# Patient Record
Sex: Female | Born: 1995 | Hispanic: No | Marital: Single
Health system: Western US, Academic
[De-identification: ages and names within clinical notes are randomized; demographics above are authoritative.]

---

## 2020-08-21 ENCOUNTER — Emergency Department
Admission: EM | Admit: 2020-08-21 | Discharge: 2020-08-21 | Discharge status: Against Medical Advice | Payer: Self-pay | Attending: Emergency Medicine | Admitting: Emergency Medicine

## 2020-08-21 DIAGNOSIS — Z5321 Procedure and treatment not carried out due to patient leaving prior to being seen by health care provider: Secondary | ICD-10-CM | POA: Insufficient documentation

## 2020-08-21 MED ORDER — ACETAMINOPHEN 325 MG PO TABS
650.0000 mg | ORAL_TABLET | Freq: Once | ORAL | Status: AC
Start: 2020-08-21 — End: 2020-08-21
  Administered 2020-08-21 (×2): 650 mg via ORAL
  Filled 2020-08-21: qty 2

## 2020-08-21 NOTE — ED Provider Notes (Signed)
Left without being seen     Rennie Plowman, MD  Resident  08/21/20 [id number]       Payton Mccallum, MD  08/24/20 [phone]

## 2022-01-15 IMAGING — MR MRI LEFT HIP WITHOUT CONTRAST
4 of 6 series · 16 of 40 positions shown · IV contrast (gadolinium)
Comparison: None

________________________________________________________________________________________________ 
MRI LEFT HIP WITHOUT CONTRAST, 01/15/2022 [DATE]: 
CLINICAL INDICATION: Osteonecrosis in diseases classified elsewhere, left thigh.
TECHNIQUE: Multiplanar, multiecho position MR images of the hip were performed 
without intravenous gadolinium enhancement. Large field-of-view images were 
performed of the pelvis to include the contralateral hip for comparison. Patient 
was scanned on a 1.5T magnet.

[Series 501: survey · axial · 15.0mm · 1.57mm/px · z∈[-206,+44]mm · 3 of 14 slices shown]
[im 1/14]
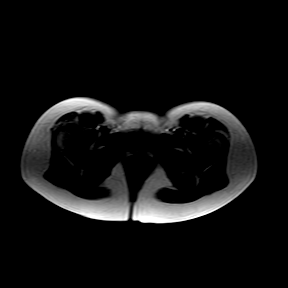
[im 7/14]
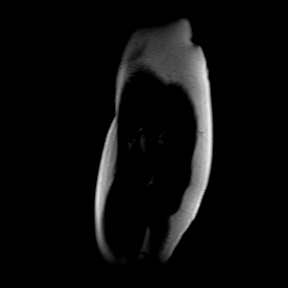
[im 14/14]
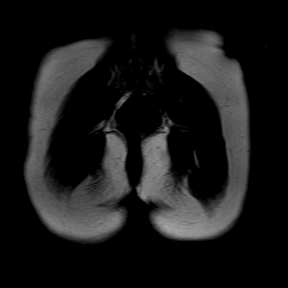

[Series 601: stir_cor-pelvis · coronal · 5.0mm · 0.66mm/px · 7 of 30 slices shown]
[im 1/30]
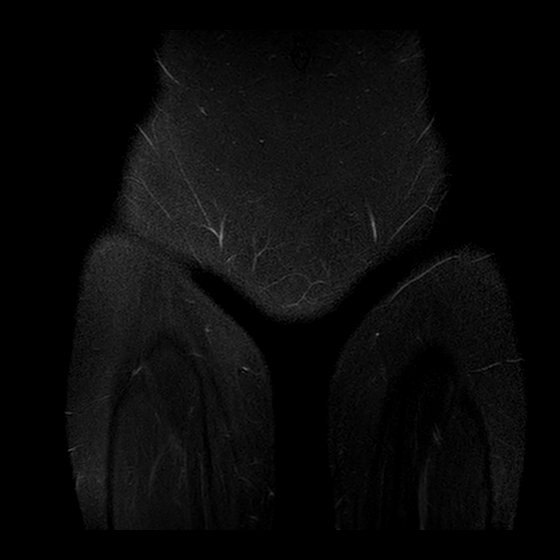
[im 5/30]
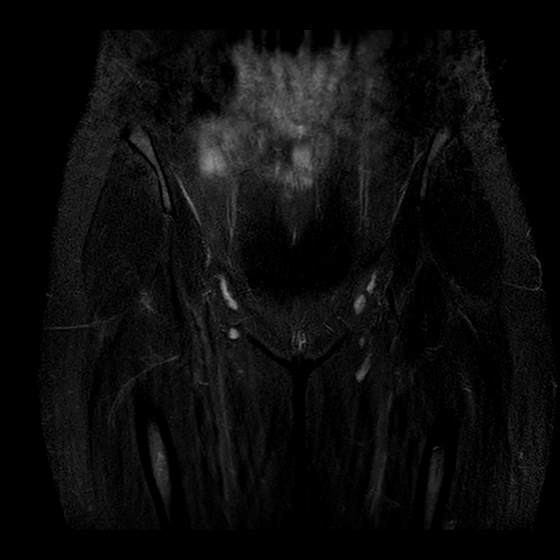
[im 10/30]
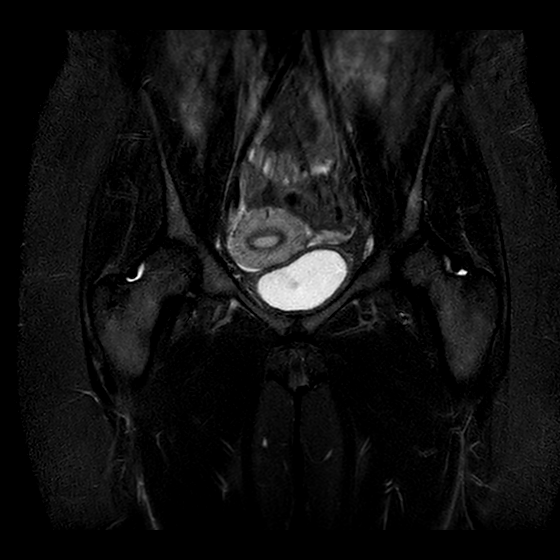
[im 15/30]
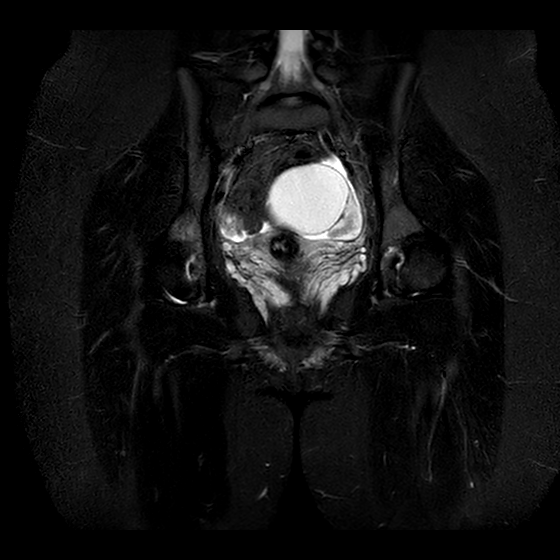
[im 20/30]
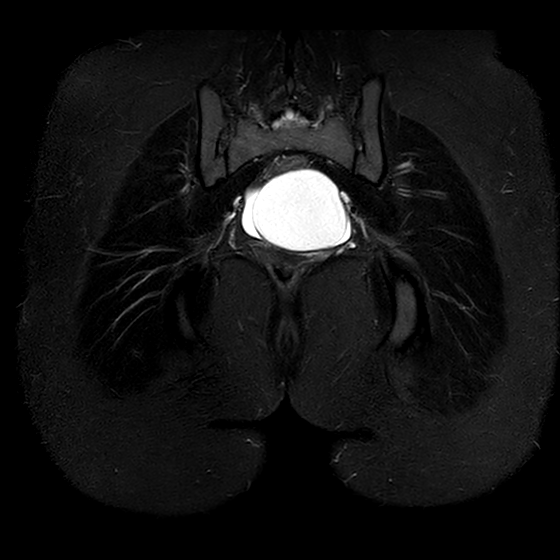
[im 25/30]
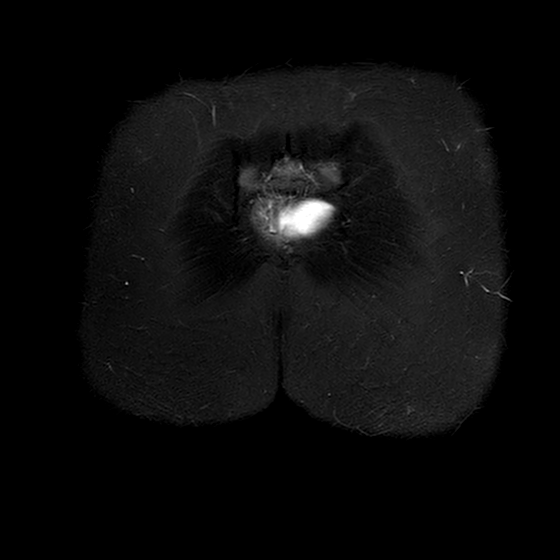
[im 30/30]
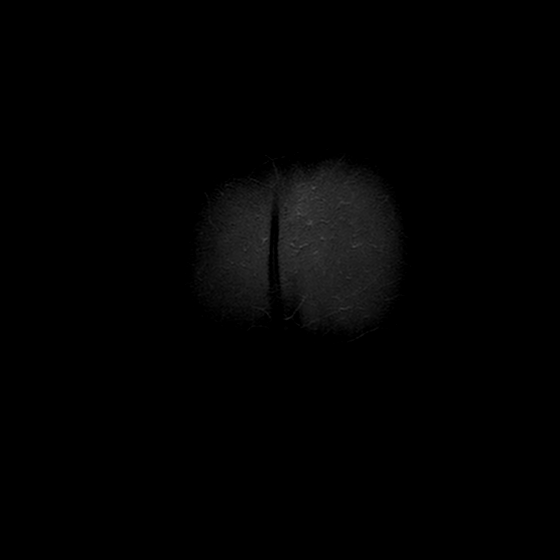

[Series 701: t1_(person_name) · axial · 5.0mm · 0.41mm/px · z∈[-263,-83]mm · 3 of 40 slices shown]
[im 5/40]
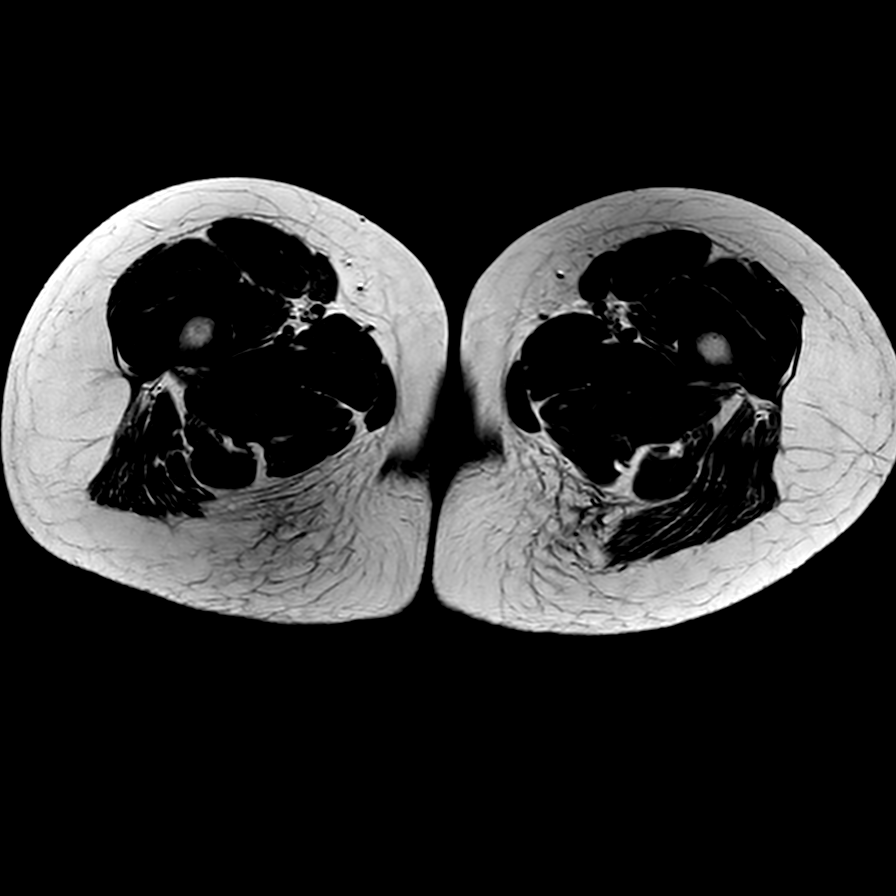
[im 22/40]
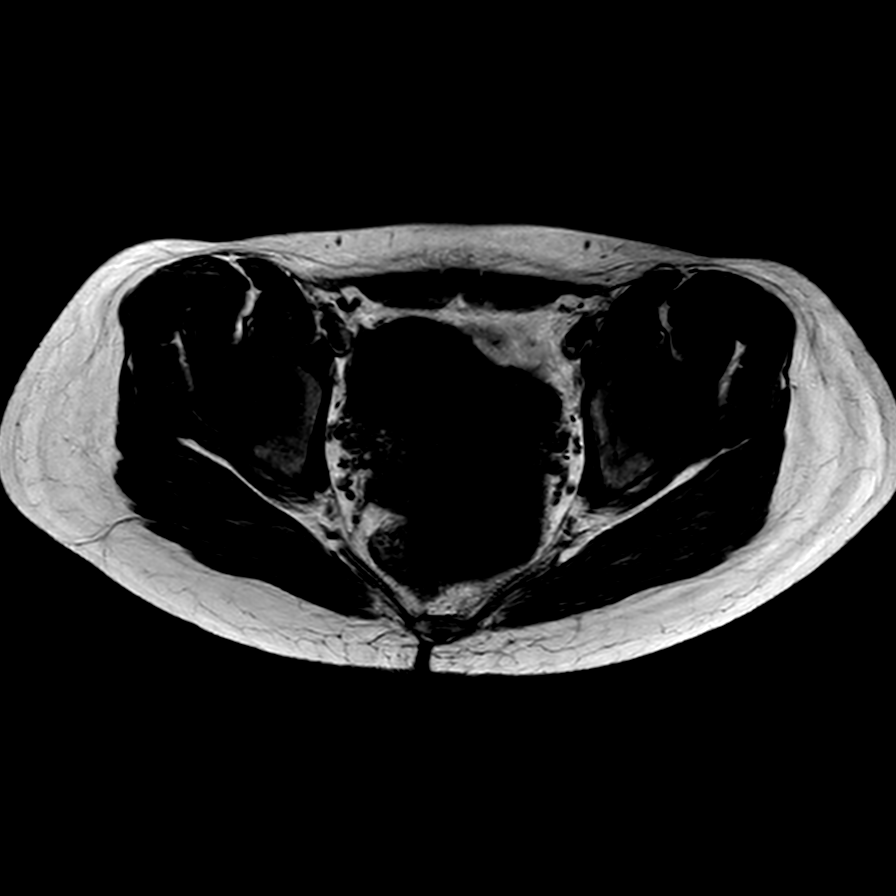
[im 35/40]
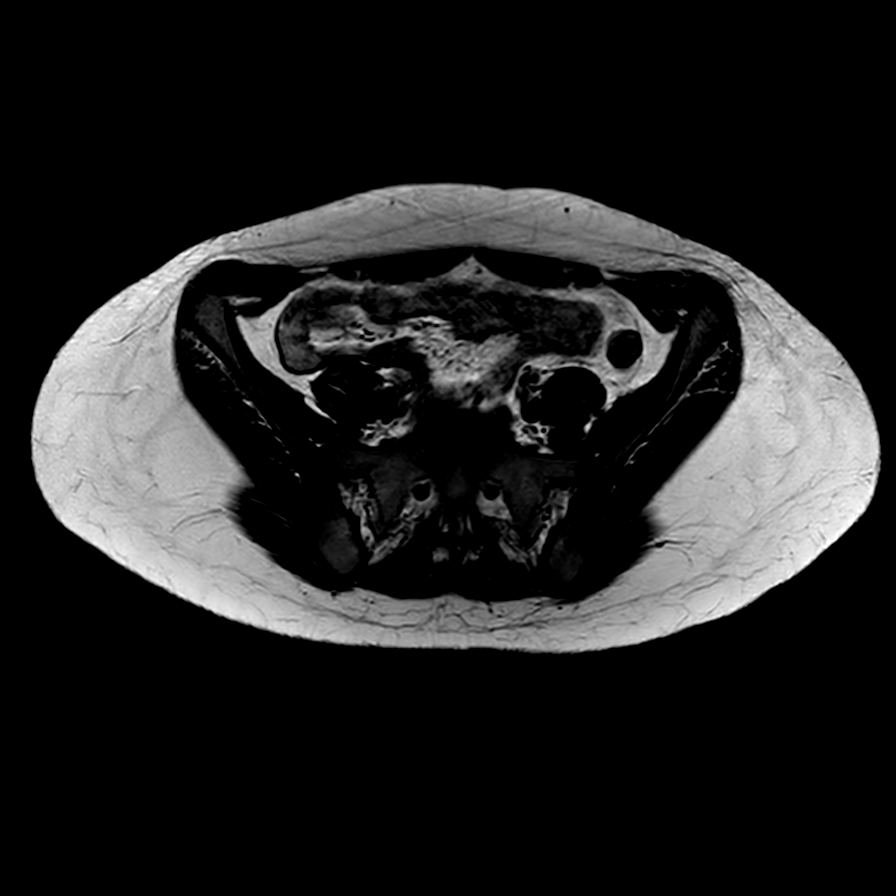

[Series 801: pd_fs_sag · sagittal · 4.0mm · 0.55mm/px · 3 of 32 slices shown]
[im 5/32]
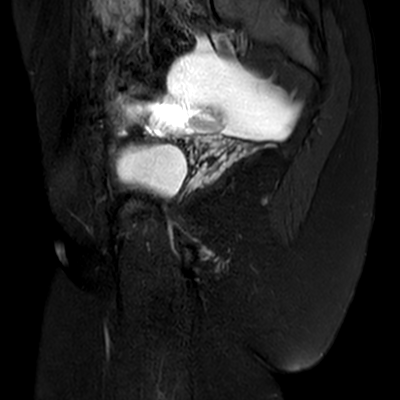
[im 18/32]
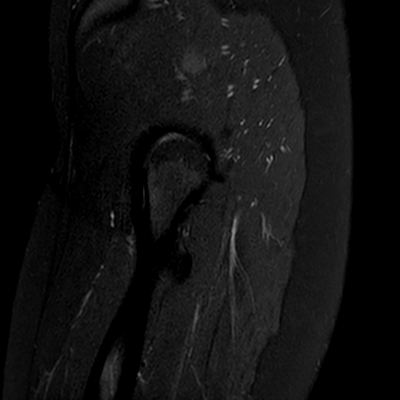
[im 27/32]
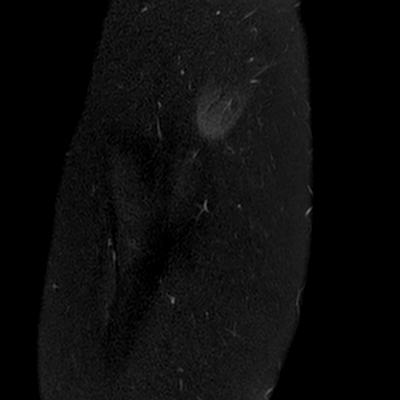

[16 of 40 positions shown; findings below may reference images not displayed]

FINDINGS: HIPS:  No discrete articular cartilaginous loss of either hip. No labral tear. 
No paralabral cyst. No hip joint effusion. Both femoral heads maintain a 
spherical configuration without evidence of avascular necrosis or subarticular 
collapse. No abnormal morphology of the proximal femurs or acetabula to 
predispose to impingement.  
BONES: Normal marrow signal intensity of the proximal hips, pelvis, sacrum and 
included lower lumbar spine. No fracture, contusion or marrow replacing lesion. 
SI joints and imaged spine are preserved. 0.9 cm sacral Tarlov cyst. 
SOFT TISSUES: The bilateral abductor cuffs are preserved. The insertions of the 
iliopsoas tendons are intact. The origins of the hamstrings are preserved. 
Rectus abdominis-adductor complex is preserved. The musculature is symmetric 
without strain, atrophy or mass. No focal fluid collection or distended bursa. 
Specifically, no iliopsoas or trochanteric bursitis. Included neurovascular 
bundles are negative. 8.3 x 5.1 x 5.9 cm left adnexal simple cyst. Small 
additional ovarian follicles. Small amount of free intraperitoneal fluid, likely 
physiologic.
IMPRESSION: 1.  No osteonecrosis. 
2.  8.3 x 5.1 x 5.9 cm left adnexal simple cyst.

## 2023-03-12 IMAGING — MR MRI PELVIS W/WO CONTRAST
14 series · 46 of 48 positions shown · IV contrast (gadavist)
Comparison: Ultrasound exam of 02/24/2023 and 11/28/2022.

________________________________________________________________________________________________ 
MRI PELVIS W/WO CONTRAST, 03/12/2023 [DATE]: 
CLINICAL INDICATION: Unspecified ovarian cyst, unspecified side
TECHNIQUE: Multiplanar, multiecho position MR images of the pelvis were 
performed without and with 6.5 mL of  Gadavist were injected intravenously by 
hand. 1 mL of Gadavist discarded. Patient was scanned on a 3T magnet.

[Series 101: survey · axial · 15.0mm · 1.76mm/px · z∈[-25,+224]mm · 2 of 11 slices shown]
[im 1/11]
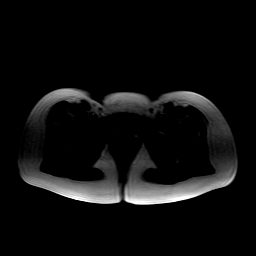
[im 11/11]
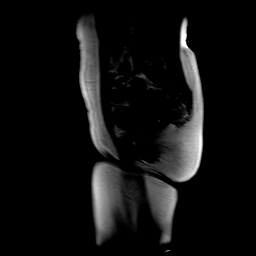

[Series 201: T2 · sagittal · 3.0mm · 0.36mm/px · 5 of 32 slices shown]
[im 1/32]
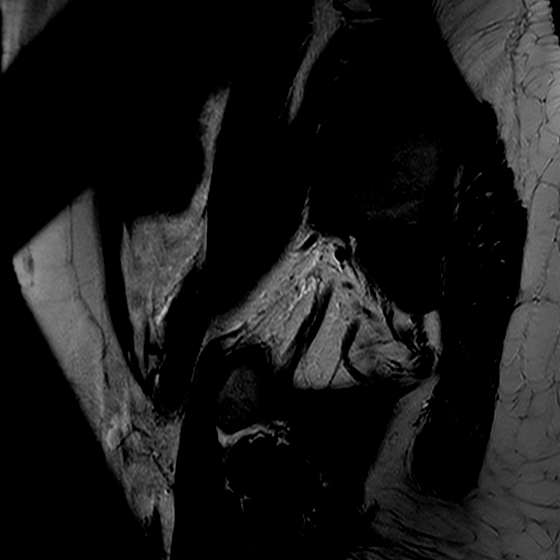
[im 8/32]
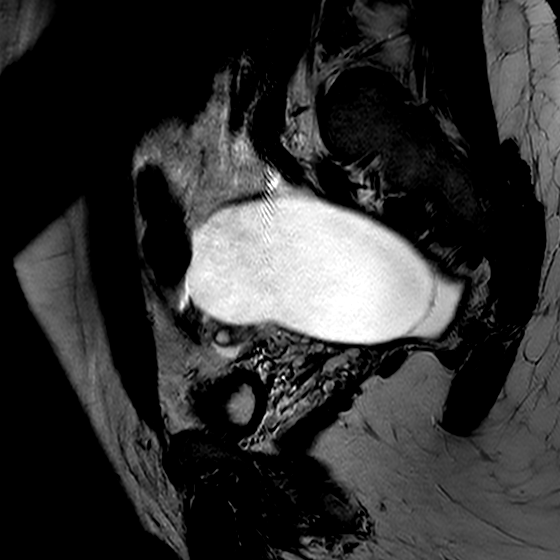
[im 16/32]
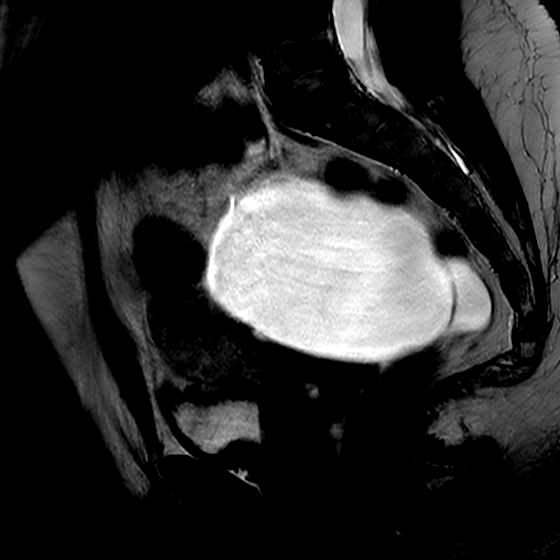
[im 24/32]
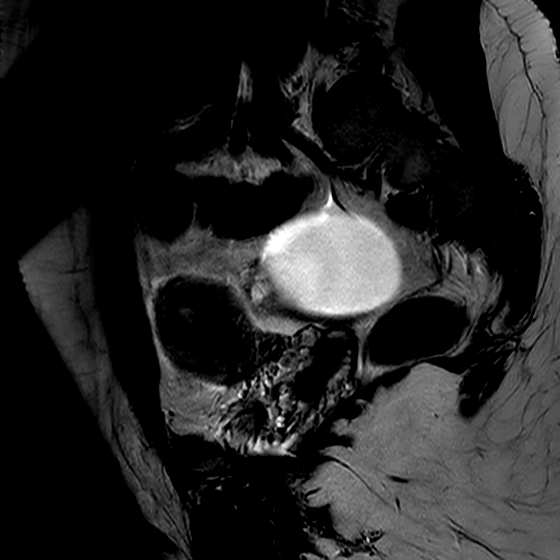
[im 32/32]
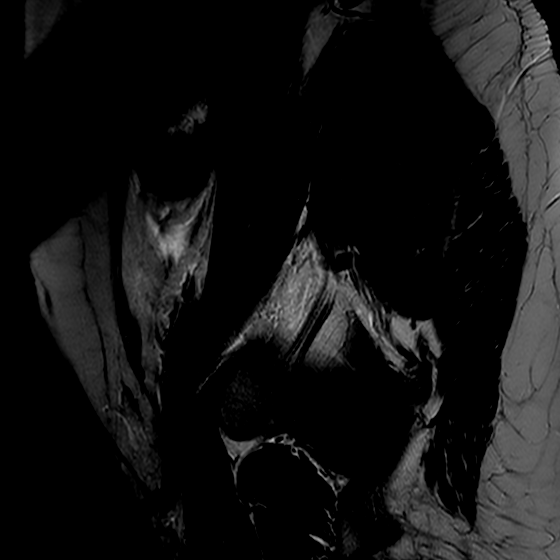

[Series 301: t2w_tse_ax · axial · 5.0mm · 0.57mm/px · z∈[-50,+106]mm · 3 of 27 slices shown]
[im 1/27]
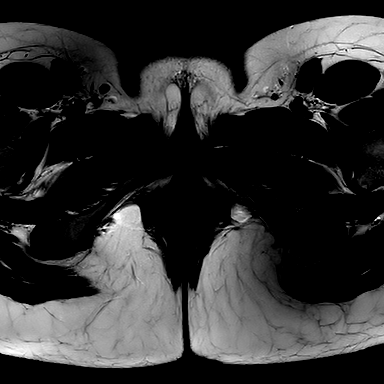
[im 14/27]
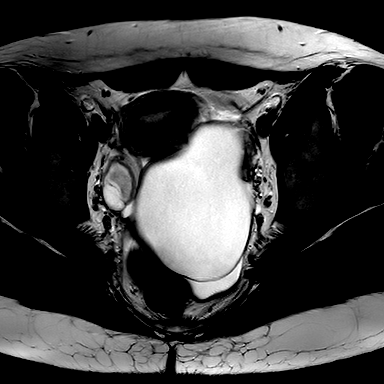
[im 27/27]
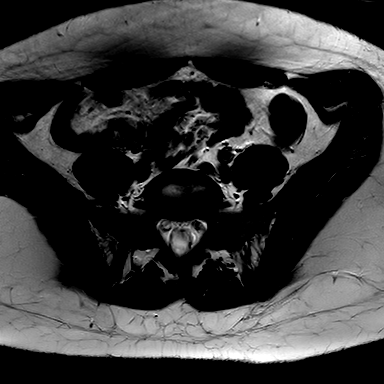

[Series 401: t1w_tse_ax · axial · 5.0mm · 0.69mm/px · z∈[-50,+106]mm · 3 of 27 slices shown]
[im 1/27]
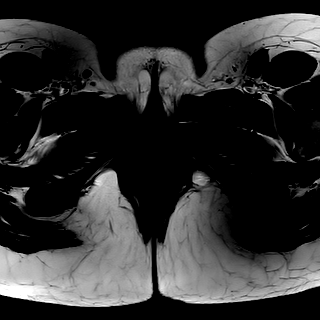
[im 14/27]
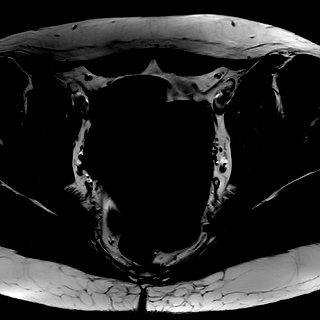
[im 27/27]
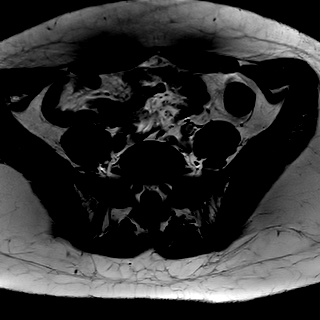

[Series 501: t1w/sp ax · axial · 5.0mm · 0.69mm/px · z∈[-50,+106]mm · 3 of 27 slices shown]
[im 1/27]
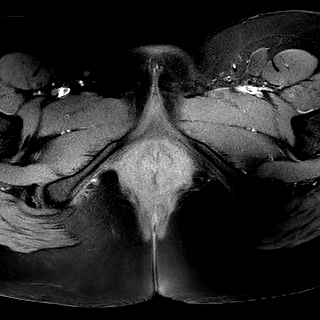
[im 14/27]
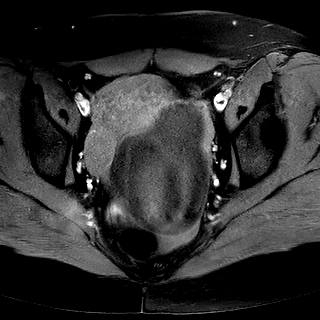
[im 27/27]
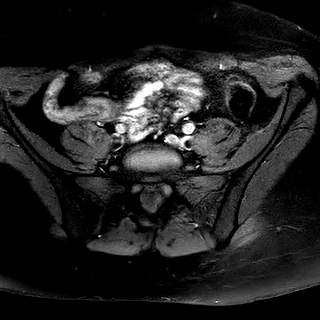

[Series 601: dwi_3b* 5mm*(mass) · axial · 5.0mm · 1.28mm/px · z∈[-38,+92]mm · 7 of 54 slices shown]
[im 1/54]
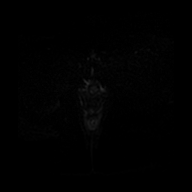
[im 9/54]
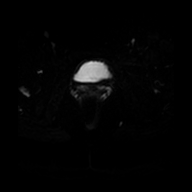
[im 18/54]
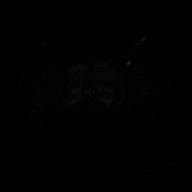
[im 27/54]
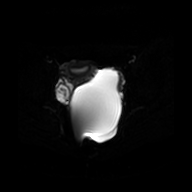
[im 36/54]
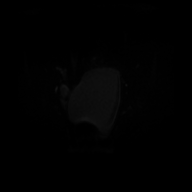
[im 45/54]
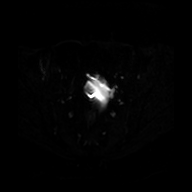
[im 54/54]
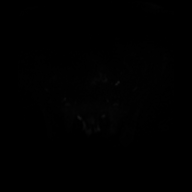

[Series 602: ADC · axial · 5.0mm · 1.28mm/px · z∈[-38,+92]mm · 3 of 27 slices shown (1 of 2)]
[im 1/27]
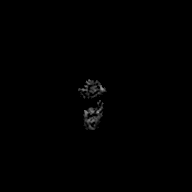
[im 14/27]
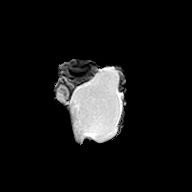
[im 27/27]
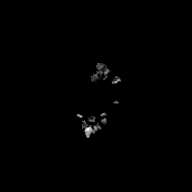

[Series 603: ADC · axial · 5.0mm · 1.28mm/px · z∈[-38,+92]mm · 3 of 27 slices shown (2 of 2)]
[im 1/27]
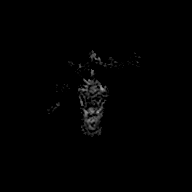
[im 14/27]
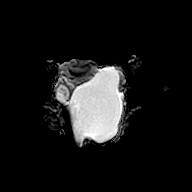
[im 27/27]
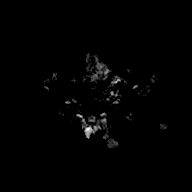

[Series 604: (id) · axial · 5.0mm · 1.28mm/px · z∈[-38,+92]mm · 3 of 27 slices shown (1 of 2)]
[im 1/27]
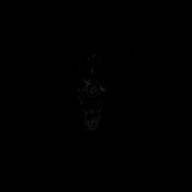
[im 14/27]
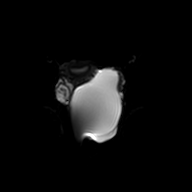
[im 27/27]
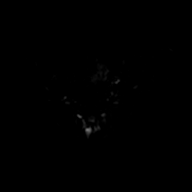

[Series 605: (id) · axial · 5.0mm · 1.28mm/px · z∈[-38,+92]mm · 3 of 27 slices shown (2 of 2)]
[im 1/27]
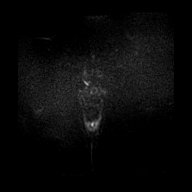
[im 14/27]
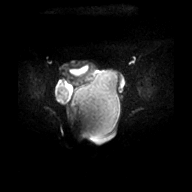
[im 27/27]
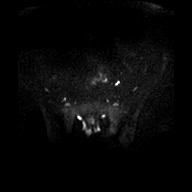

[Series 701: t2w_tse_cor · coronal · 4.0mm · 0.57mm/px · 3 of 27 slices shown]
[im 1/27]
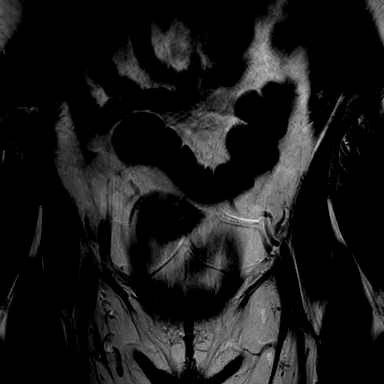
[im 14/27]
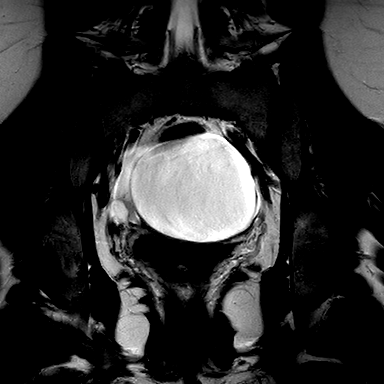
[im 27/27]
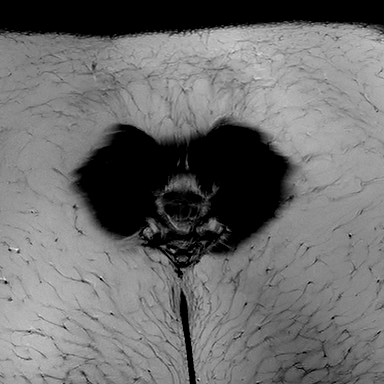

[Series 801: (person_name)1(person_name)/(person_name) · axial · 5.0mm · 0.69mm/px · z∈[-50,+106]mm · 3 of 27 slices shown]
[im 1/27]
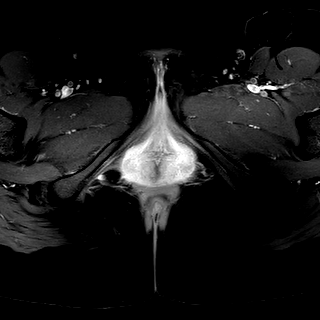
[im 14/27]
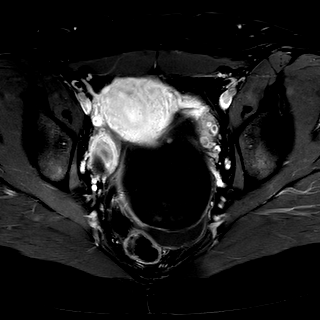
[im 27/27]
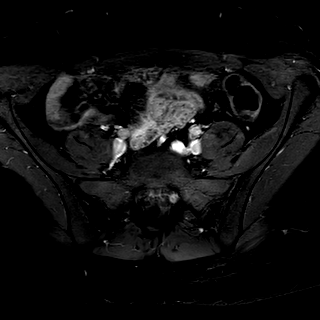

[Series 901: (person_name)1/sp cor · coronal · 4.0mm · 0.69mm/px · 4 of 30 slices shown]
[im 1/30]
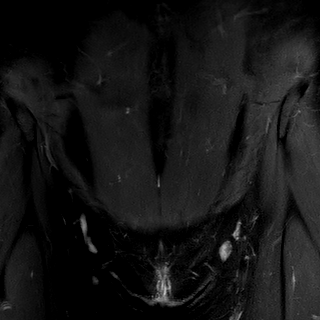
[im 10/30]
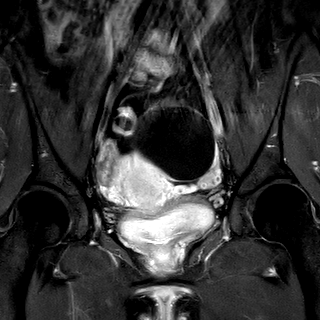
[im 20/30]
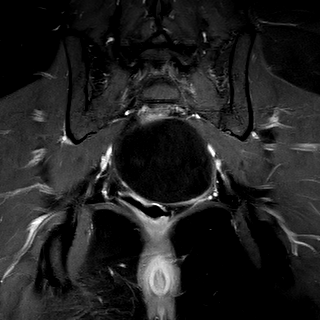
[im 30/30]
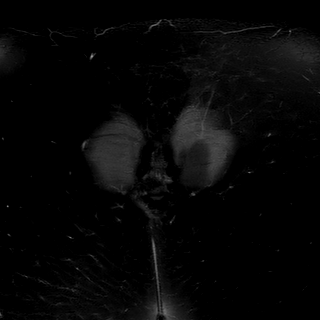

[Series 1001: (person_name)1 sag sp · sagittal · 4.0mm · 0.39mm/px · 1 of 28 slices shown]
[im 1/28]
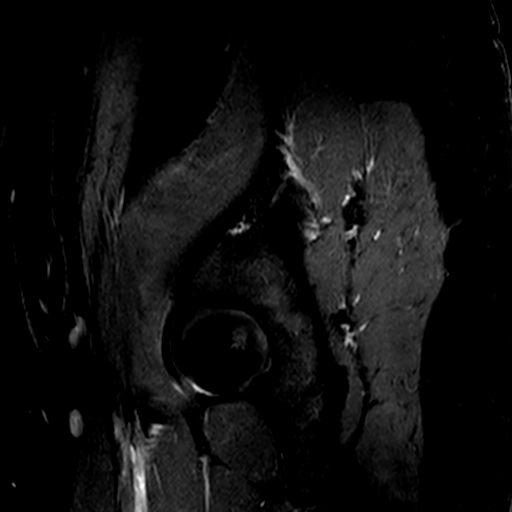

[46 of 48 positions shown; findings below may reference images not displayed]

FINDINGS: There is a left-sided adnexal cyst containing a thin septation measuring up to 
10.3 x 6.7 x 10.5 cm. No hemorrhagic or enhancing components. On the prior 
ultrasound exams there is a left-sided ovarian cyst measuring up to 1.5 x 4.8 x 
8.4 on the most recent ultrasound exam of 02/24/2023. There are additional 
bilateral ovarian cysts. These are subcentimeter on the left. On the right there 
is an involuting cyst measuring up to 3 x 2 x 1.5 cm. 
The uterus is anteverted and measures 7.8 x 4 x 4.3 cm. Normal junctional zone. 
There are a few subcentimeter nabothian cysts. The bladder is minimally 
distended. Included bowel loops are negative. Included muscular skeletal system 
is negative.
IMPRESSION: Persistent left adnexal cyst without solid mass or area of abnormal enhancement.

## 2023-05-27 IMAGING — DX SHOULDER RIGHT 2 VIEWS
2 series · 2 of 2 positions shown · non-contrast
Comparison: None.

________________________________________________________________________________________________ 
SHOULDER RIGHT 2 VIEWS, 05/27/2023 [DATE]: 
CLINICAL INDICATION: Pain In Right Shoulder

[AP]
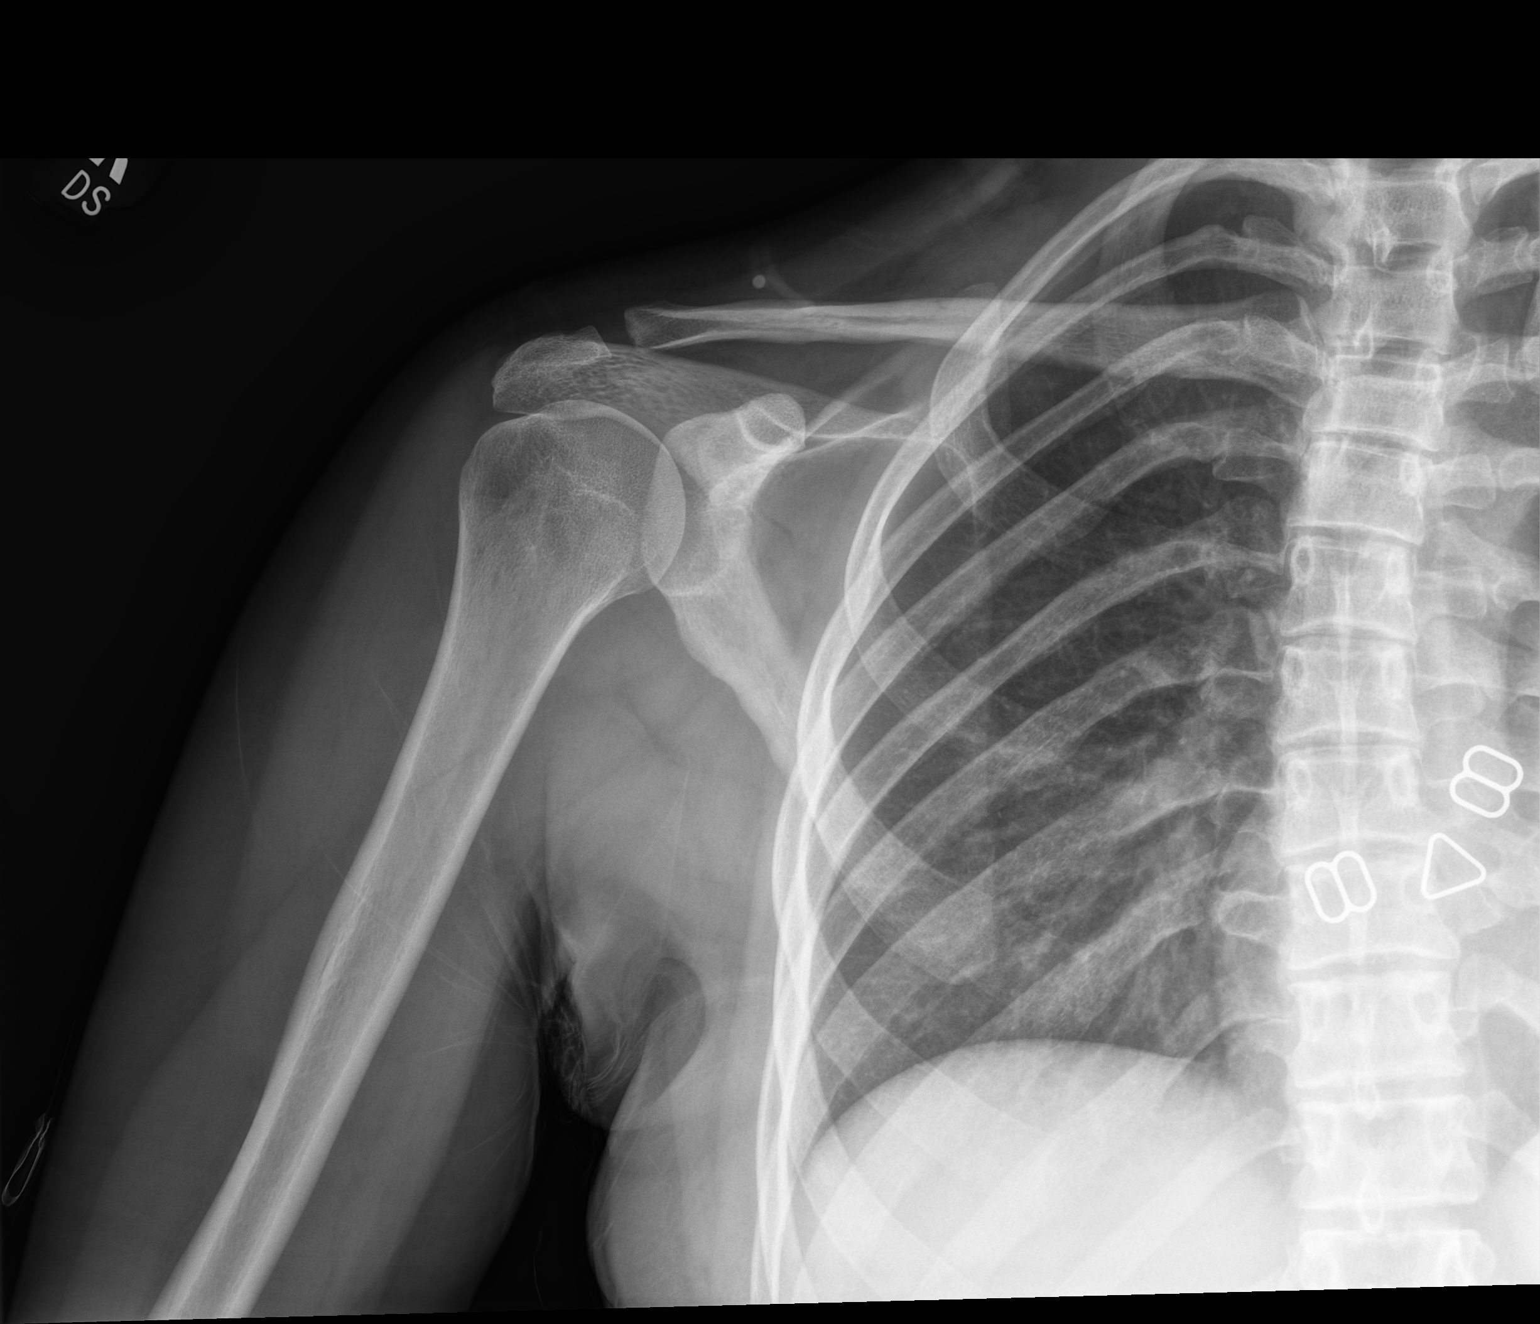

[grashey]
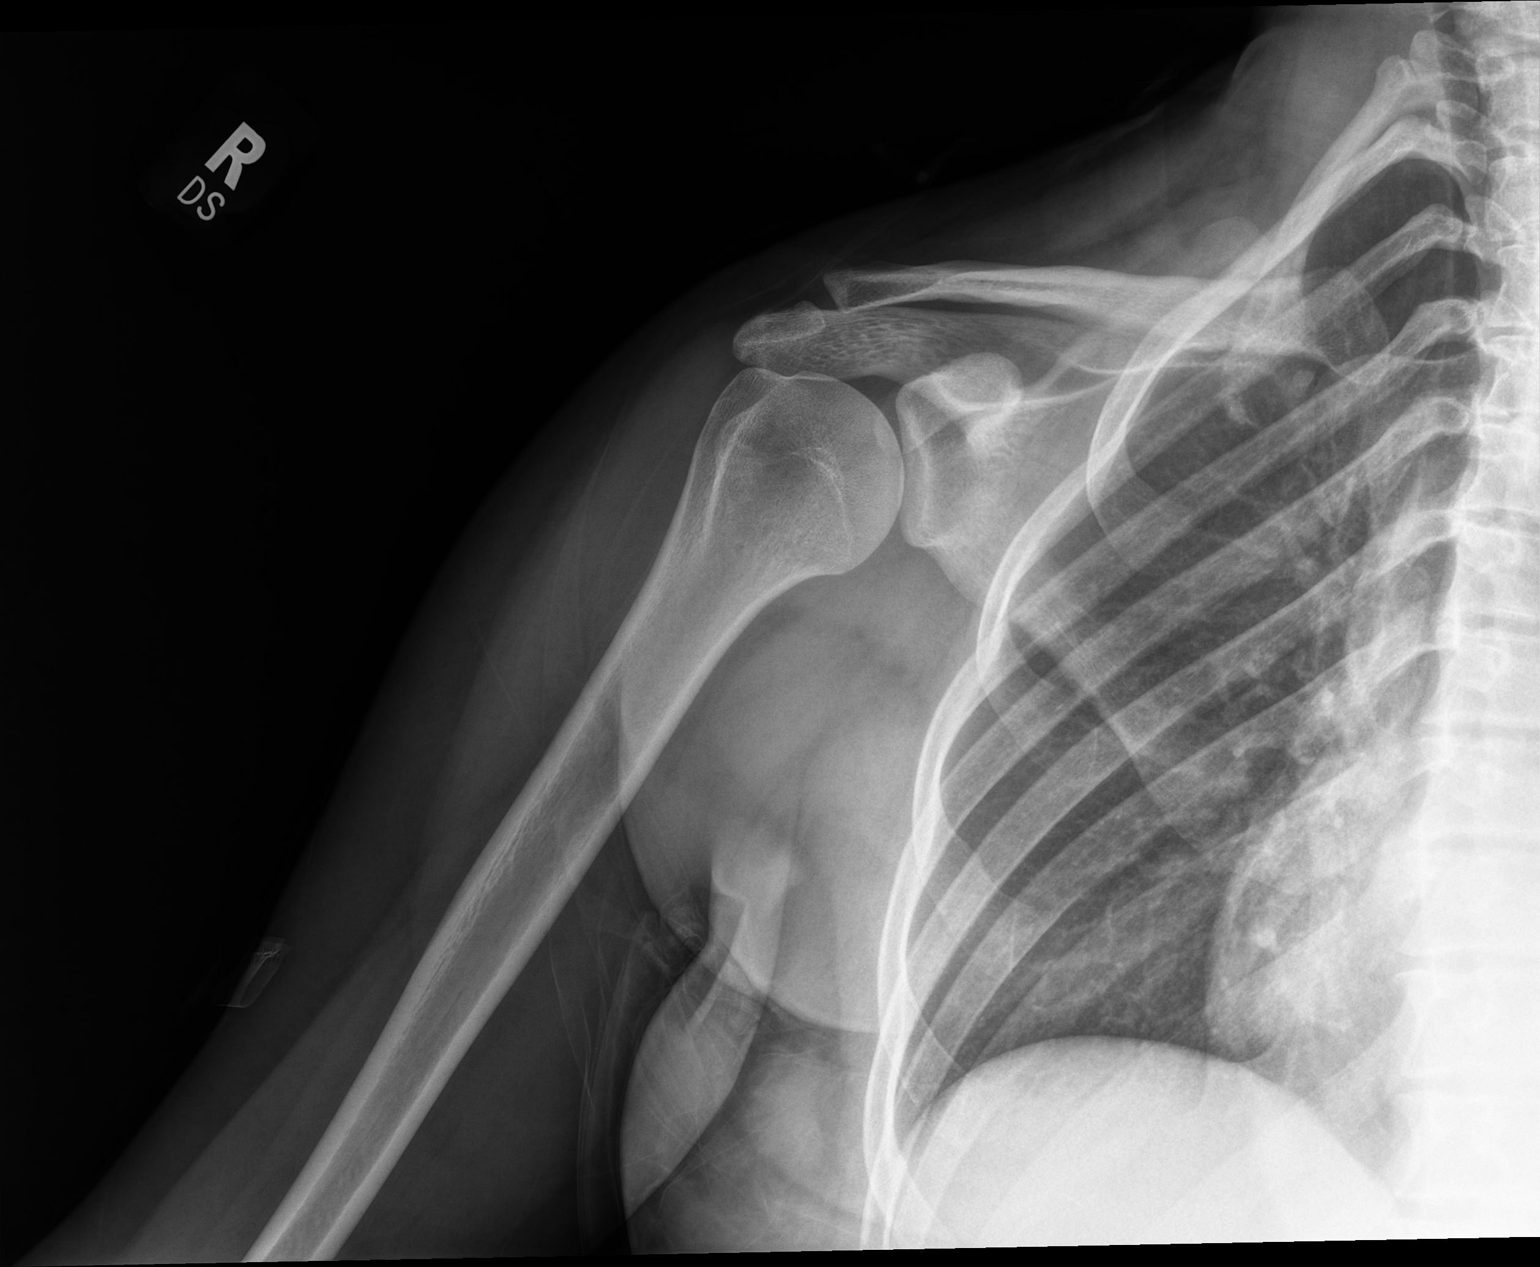

[2 of 2 positions shown; findings below may reference images not displayed]

FINDINGS: Right humeral head is well located within the glenoid fossa. The 
acromioclavicular and coracoclavicular spaces are preserved. No fracture. No 
subluxation. Included portions of the lungs are clear. Mild thoracic curvature.
IMPRESSION: Negative right shoulder.

## 2023-05-27 IMAGING — CT CT CHEST WITHOUT CONTRAST
2 of 4 series · 15 of 36 positions shown, 18 images · non-contrast
Comparison: Chest x-ray 05/06/2023.

________________________________________________________________________________________________ 
CT CHEST WITHOUT CONTRAST, 05/27/2023 [DATE]: 
CLINICAL INDICATION: Cough, Unspecified 
A search for DICOM formatted images was conducted for prior CT imaging studies 
completed at a non-affiliated media free facility.
TECHNIQUE: The chest was scanned from base of neck through the lung bases 
without contrast on a high resolution low dose CT scanner. Routine MPR and MIP 
reconstruction images were performed.

[Series 2: chest 2.0 i31s 3 · axial · 0.70mm/px · z∈[-304,-4]mm · 12 of 166 slices shown, 15 images]
[im 8/166  mediastinal]
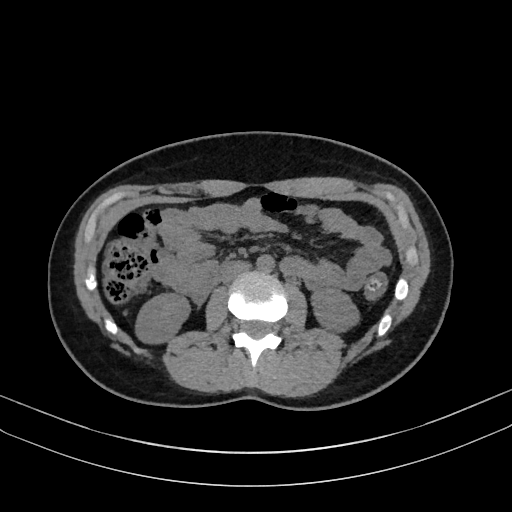
[im 8/166  lung]
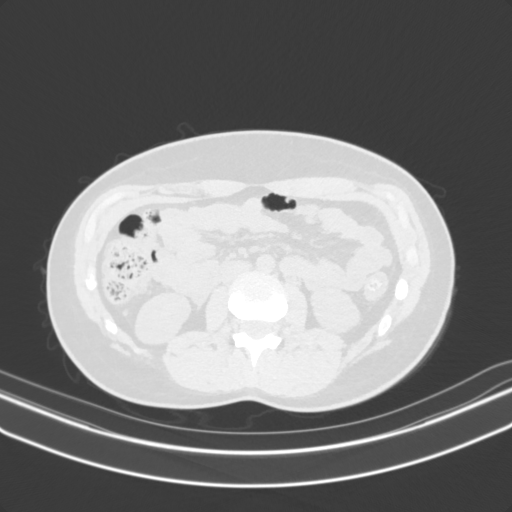
[im 23/166  lung]
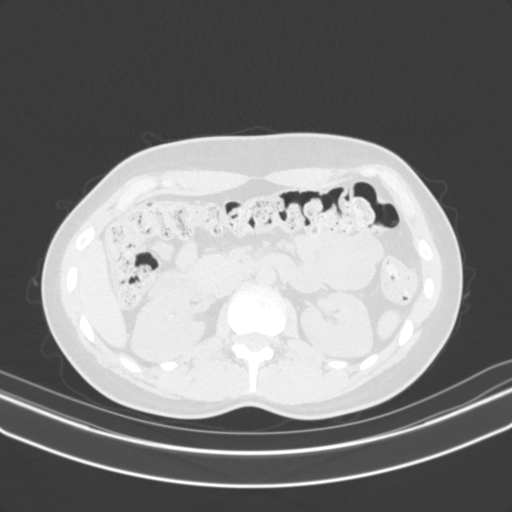
[im 38/166  lung]
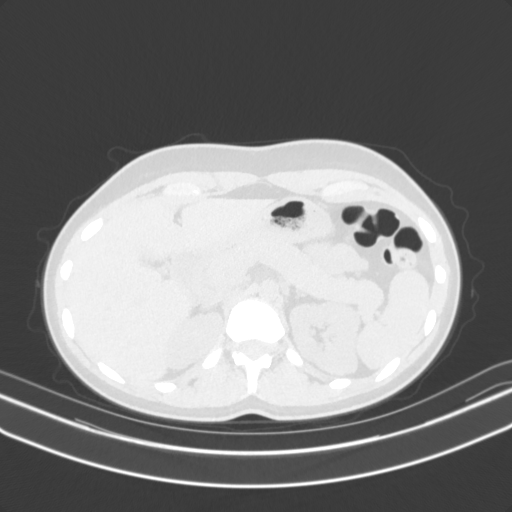
[im 53/166  lung]
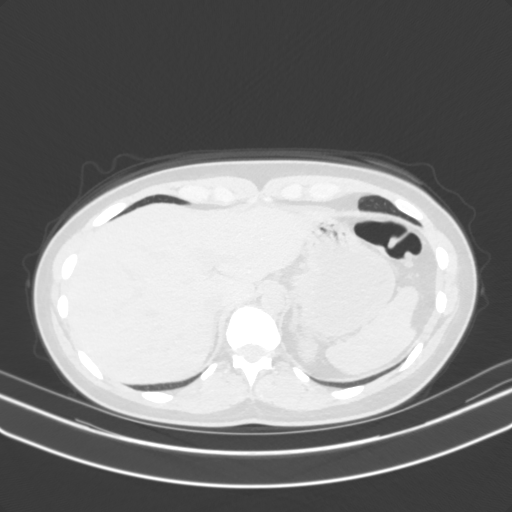
[im 61/166  mediastinal]
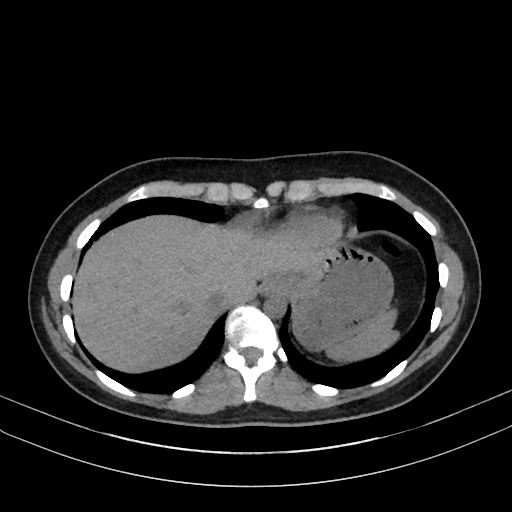
[im 61/166  lung]
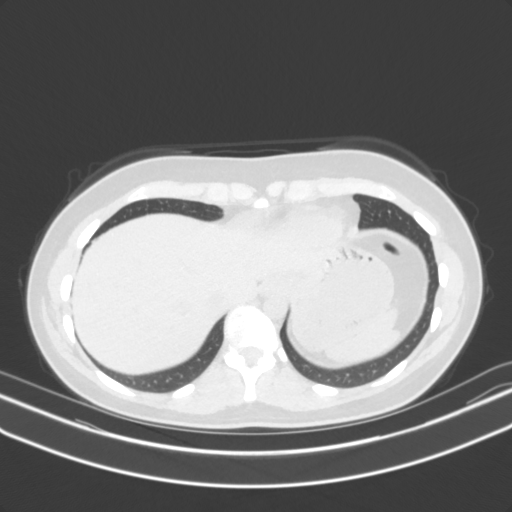
[im 76/166  lung]
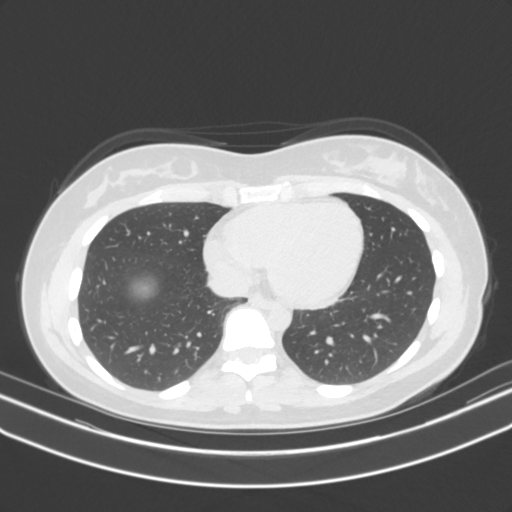
[im 91/166  lung]
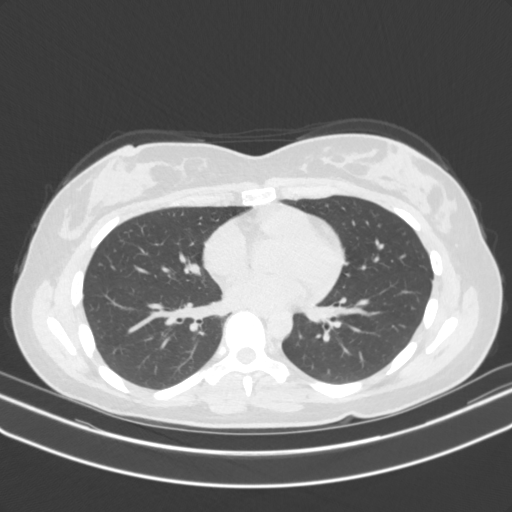
[im 106/166  lung]
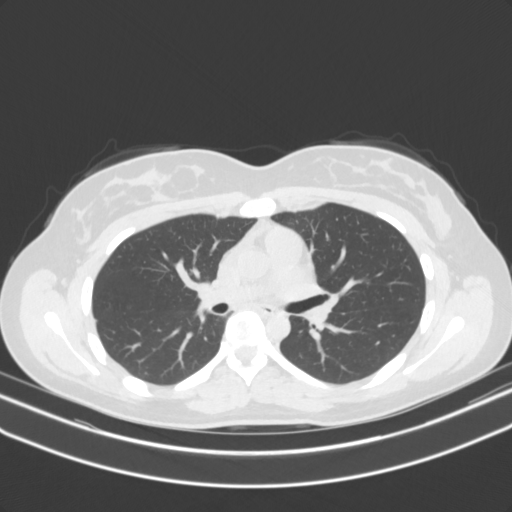
[im 113/166  mediastinal]
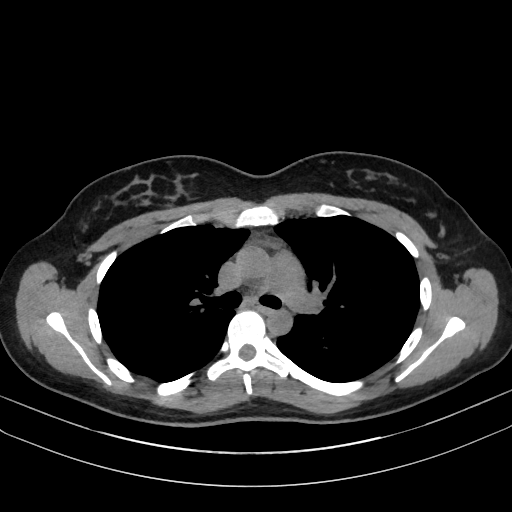
[im 113/166  lung]
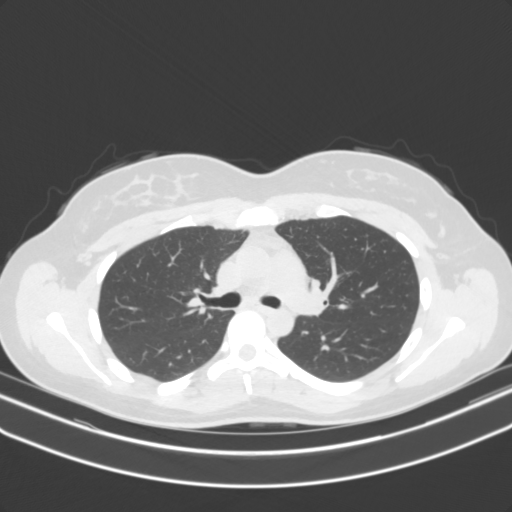
[im 128/166  lung]
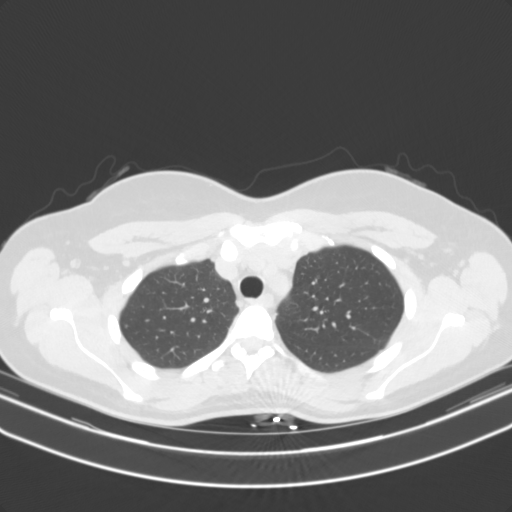
[im 143/166  lung]
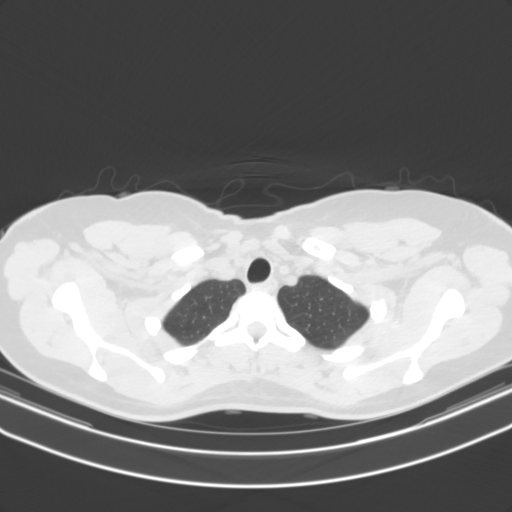
[im 158/166  lung]
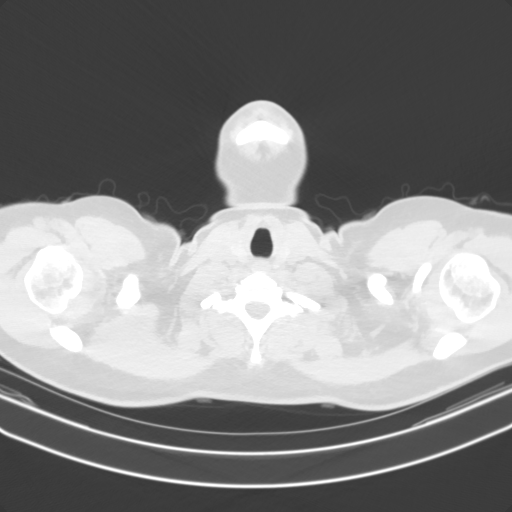

[Series 4: coronal · coronal · 0.65mm/px · 3 of 113 slices shown]
[im 23/113  lung]
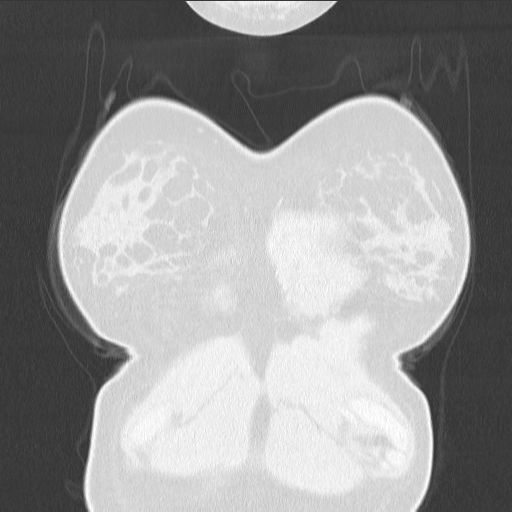
[im 45/113  lung]
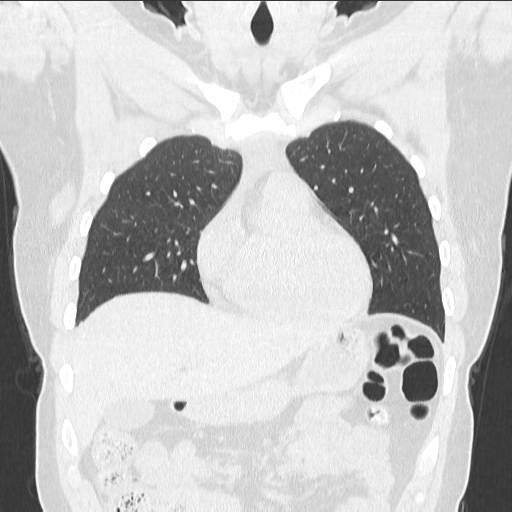
[im 68/113  lung]
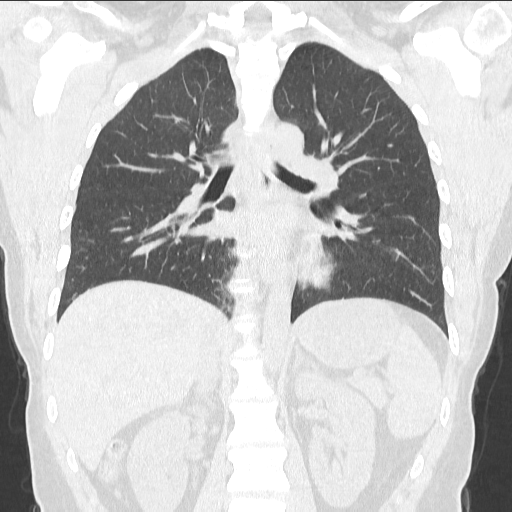

[15 of 36 positions shown; findings below may reference images not displayed]

Count of known CT and Cardiac Nuclear Medicine studies performed in the previous 
12 months = 0.
FINDINGS: LUNGS AND PLEURA:  Mild peribronchial thickening. No focal bronchiectasis. 
Scattered noncalcified micronodules with largest along the right major fissure 
measures approximately 3 mm. No acute consolidation or effusion.  
MEDIASTINUM:  No adenopathy. Normal heart size. No pericardial effusion. No 
coronary artery calcifications noted. Residual thymus 
CHEST WALL/AXILLA: No mass or adenopathy.  
UPPER ABDOMEN: Punctate calculus inferior pole right kidney.. 
MUSCULOSKELETAL: No acute abnormality.
IMPRESSION: Scattered noncalcified micronodules with the largest along the right major 
fissure measuring 3 mm most likely representing a fissural lymph node. Please 
refer to the [HOSPITAL] recommendations for follow-up below.  
Incidental, multiple solid nodules < 6 mm: 
Low Risk: No Routine follow-up.  
High risk: Optional CT at 12 months.  
NOTE: These recommendations do not apply to patients with immunosuppression or 
patients with known primary cancer.  
REFERENCE: [HOSPITAL] 1122 Guidelines for Management of Incidentally 
Detected Pulmonary Nodules in Adults. Radiology 1122.  
Mild peribronchial thickening but no acute consolidation or effusion. 
Punctate nonobstructing right renal calculus. 
No mediastinal or hilar adenopathy. 
In patients between the ages of 50-77 where pulmonary emphysema is noted on CT, 
recommend evaluation for low dose lung cancer screening protocol if patient is 
not already enrolled; as pulmonary emphysema is an independent risk factor for 
lung cancer. 
RADIATION DOSE REDUCTION: All CT scans are performed using radiation dose 
reduction techniques, when applicable.  Technical factors are evaluated and 
adjusted to ensure appropriate moderation of exposure.  Automated dose 
management technology is applied to adjust the radiation doses to minimize 
exposure while achieving diagnostic quality images.

## 2023-05-29 IMAGING — MR MRI LUMBAR SPINE WITHOUT CONTRAST
4 of 8 series · 7 of 48 positions shown · IV contrast (gadolinium)
Comparison: MRI pelvis from March 12, 2023.

________________________________________________________________________________________________ 
MRI LUMBAR SPINE WITHOUT CONTRAST, 05/29/2023 [DATE]: 
CLINICAL INDICATION: Left-sided pain with radiation down into the foot.
TECHNIQUE: Multiplanar, multiecho position MR images of the lumbar spine were 
performed without intravenous gadolinium enhancement.

[Series 101: survey · axial · 10.0mm · 1.39mm/px · 1 of 9 slices shown]
[im 1/9]
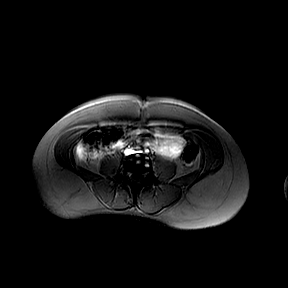

[Series 201: t2w_cor-surv · coronal · 6.0mm · 0.50mm/px · 1 of 5 slices shown]
[im 1/5]
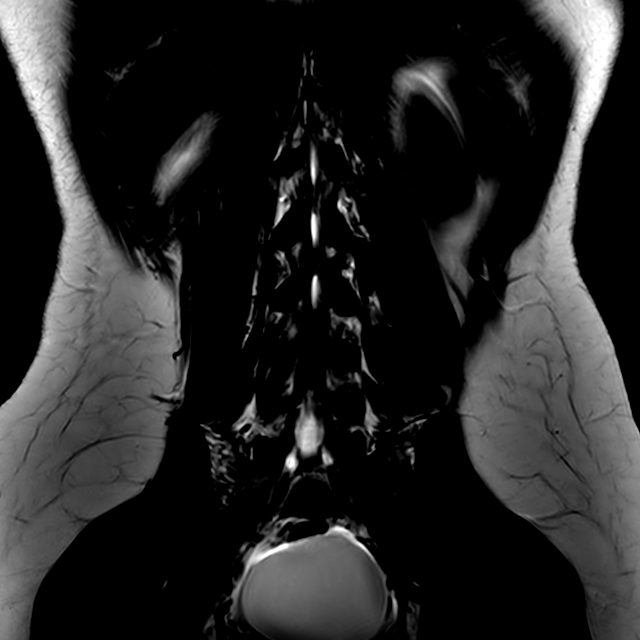

[Series 301: t1w_tse sag · sagittal · 4.0mm · 0.24mm/px · 2 of 17 slices shown]
[im 1/17]
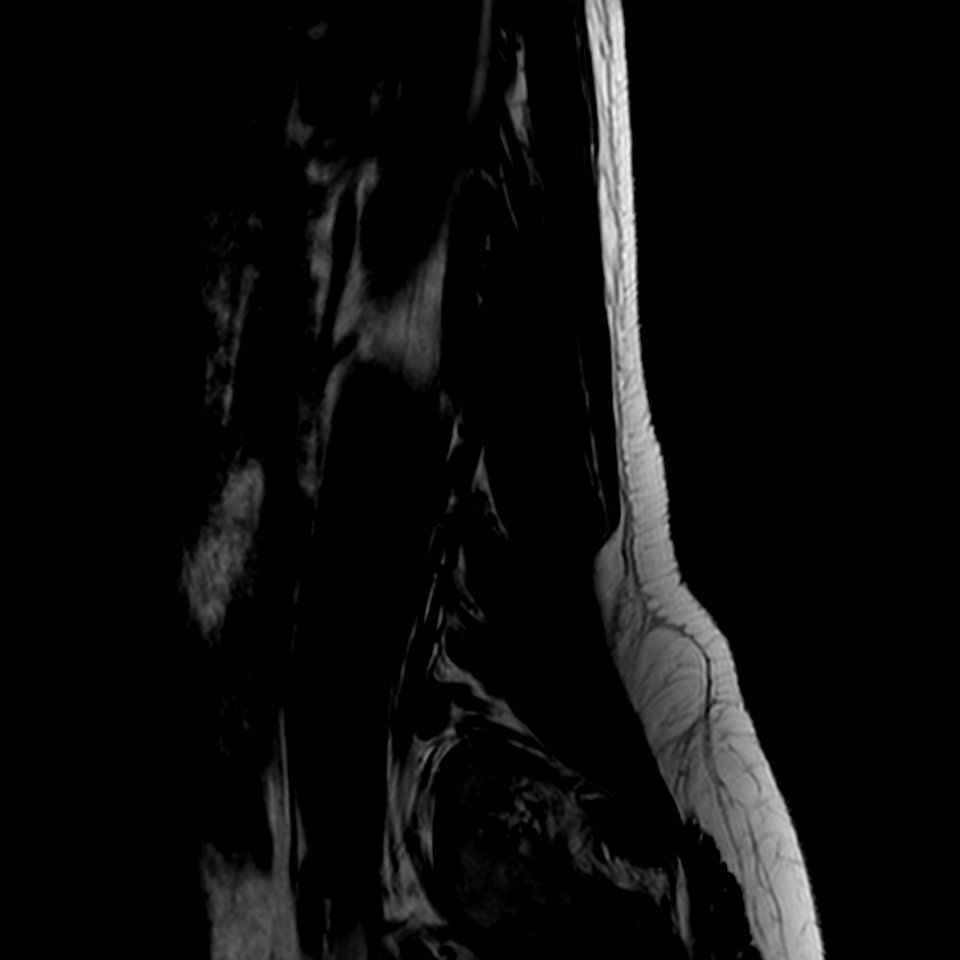
[im 17/17]
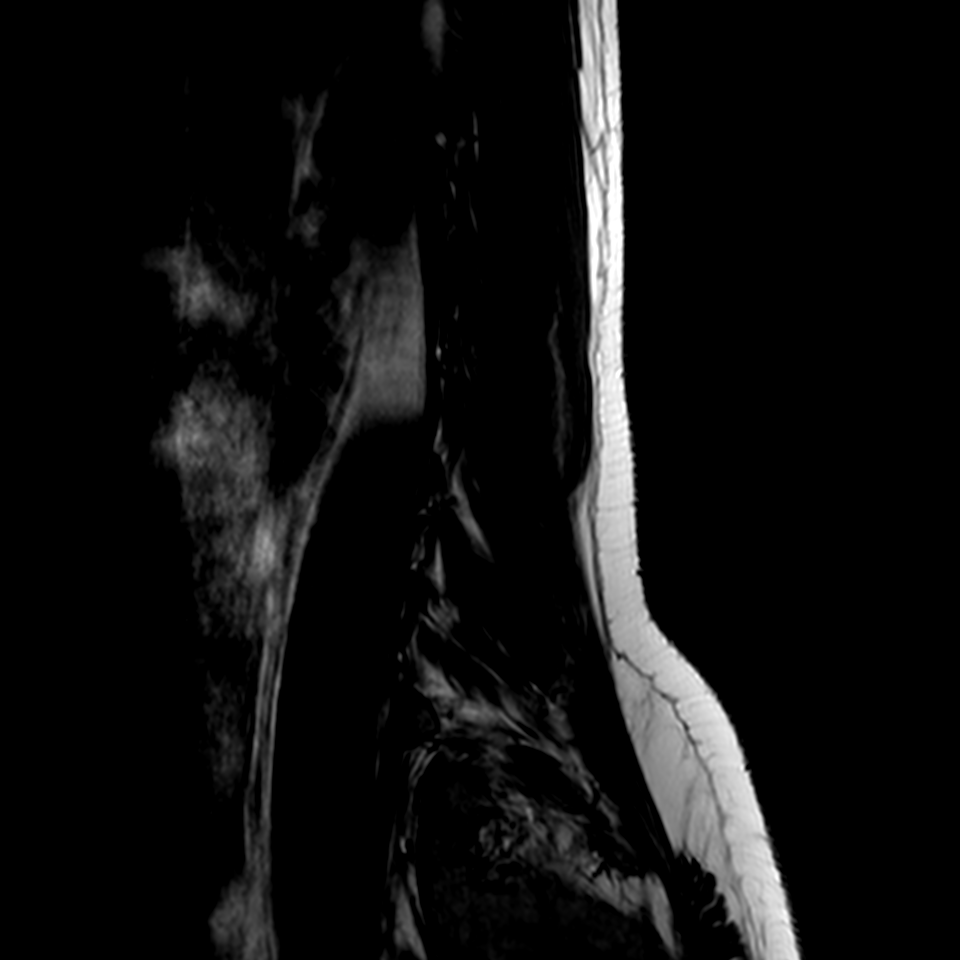

[Series 401: t2w_tse sag · sagittal · 4.0mm · 0.24mm/px · 3 of 17 slices shown]
[im 1/17]
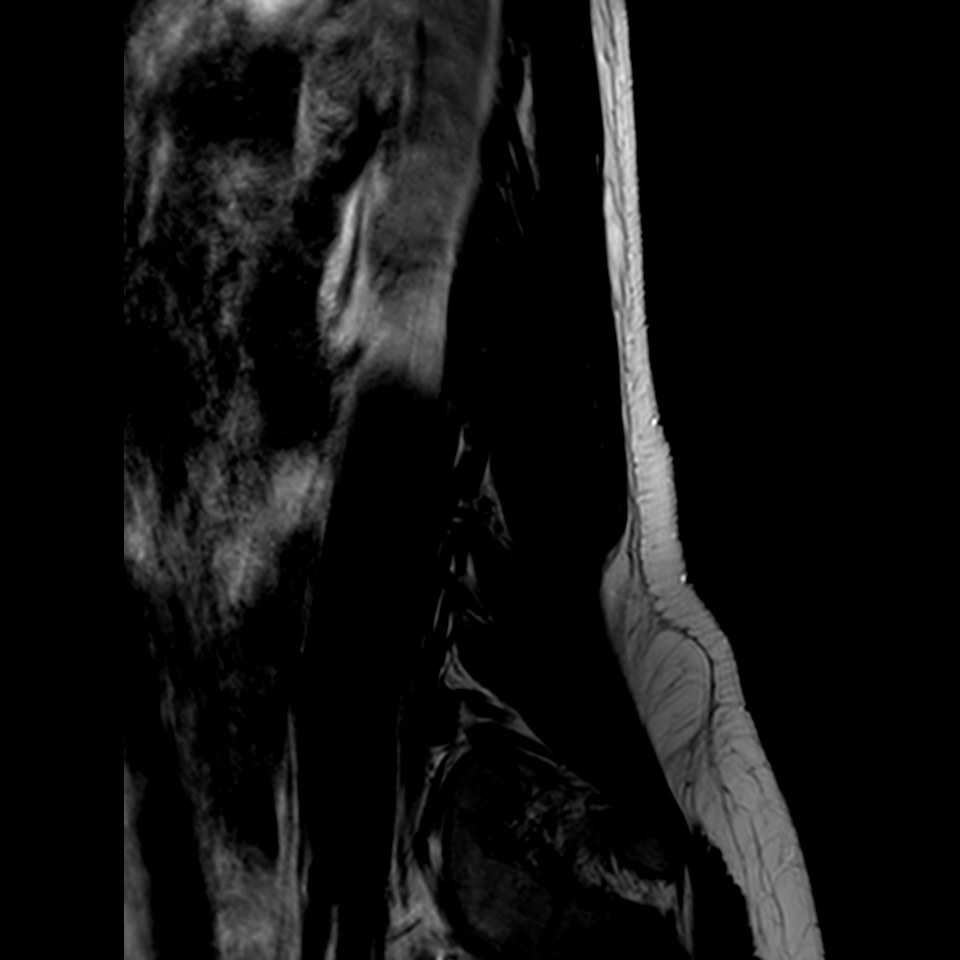
[im 9/17]
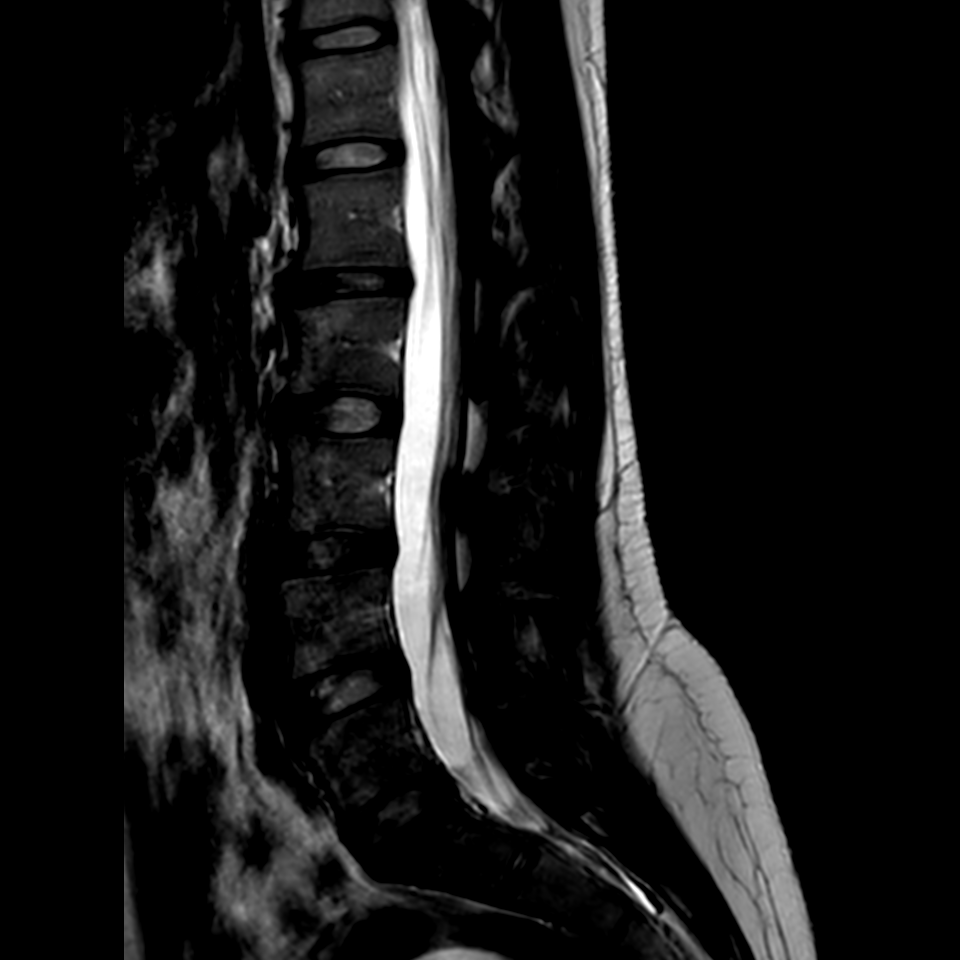
[im 17/17]
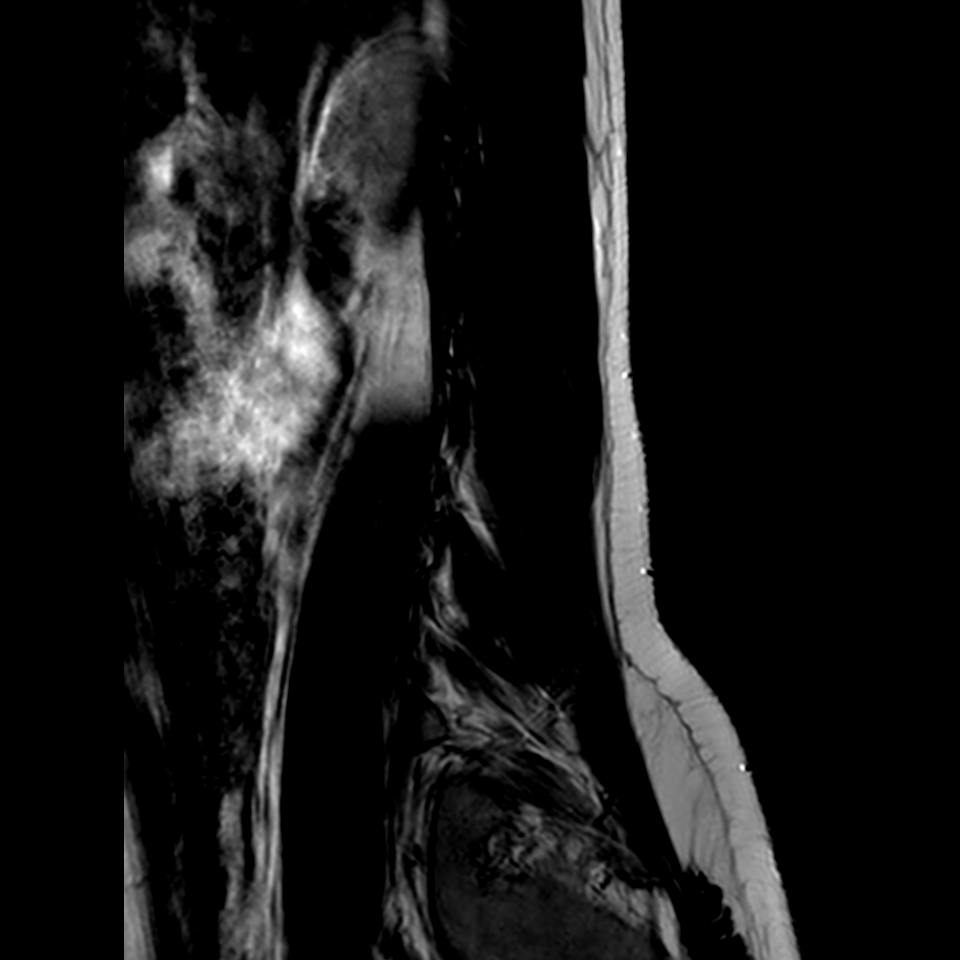

[7 of 48 positions shown; findings below may reference images not displayed]

FINDINGS: -------------------------------------------------------------------------------- 
------ 
GENERAL: 
Nomenclature is based on 5 lumbar type vertebral bodies.     
ALIGNMENT: Mild leftward curvature at the thoracolumbar junction, straightening 
of the lumbar lordosis of the upper lumbar spine. 
VERTEBRAL BODY HEIGHT: Normal.  
MARROW SIGNAL: No focal suspect signal abnormality. 
CORD SIGNAL: Normal distal spinal cord and cauda equina.  Conus medullaris 
terminates at L1-L2. 
ADDITIONAL FINDINGS: Partially visualized cyst in the pelvis. 
Modic I-II: None. 
Ligamentum Flavum > 2.5 mm: All levels. 
-------------------------------------------------------------------------------- 
------ 
SEGMENTAL: 
T12-L1: No significant central canal narrowing.  No significant right neural 
foraminal narrowing. No significant left neural foraminal narrowing.  
L1-L2: Disc bulge; no significant central canal narrowing.  No significant right 
neural foraminal narrowing. No significant left neural foraminal narrowing.  
L2-L3: No significant central canal narrowing.  No significant right neural 
foraminal narrowing. No significant left neural foraminal narrowing.  
L3-L4: Trace disc bulge; no significant central canal narrowing.  No significant 
right neural foraminal narrowing. No significant left neural foraminal 
narrowing.  
L4-L5: No significant central canal narrowing.  No significant right neural 
foraminal narrowing. No significant left neural foraminal narrowing.  
L5-S1: No significant central canal narrowing.  No significant right neural 
foraminal narrowing. No significant left neural foraminal narrowing.  
-------------------------------------------------------------------------------- 
------
IMPRESSION: 1.  No significant stenosis noting mild discogenic change at L1-L2 and L3-L4. 
2.  Partially visualized cyst in the pelvis.
